# Patient Record
Sex: Female | Born: 1980 | Race: White | Hispanic: No | Marital: Married | Smoking: Never smoker
Health system: Southern US, Community
[De-identification: ages and names within clinical notes are randomized; demographics above are authoritative.]

## PROBLEM LIST (undated history)

## (undated) ENCOUNTER — Inpatient Hospital Stay (HOSPITAL_COMMUNITY): Payer: BC Managed Care – PPO

## (undated) DIAGNOSIS — R200 Anesthesia of skin: Secondary | ICD-10-CM

## (undated) DIAGNOSIS — R202 Paresthesia of skin: Secondary | ICD-10-CM

## (undated) DIAGNOSIS — M545 Low back pain, unspecified: Secondary | ICD-10-CM

## (undated) HISTORY — DX: Anesthesia of skin: R20.0

## (undated) HISTORY — PX: TUBAL LIGATION: SHX77

## (undated) HISTORY — PX: HAND SURGERY: SHX662

## (undated) HISTORY — DX: Low back pain, unspecified: M54.50

## (undated) HISTORY — DX: Paresthesia of skin: R20.2

---

## 2008-03-26 ENCOUNTER — Inpatient Hospital Stay (HOSPITAL_COMMUNITY): Admission: AD | Admit: 2008-03-26 | Discharge: 2008-03-26 | Payer: Self-pay | Admitting: Obstetrics and Gynecology

## 2008-05-01 ENCOUNTER — Inpatient Hospital Stay (HOSPITAL_COMMUNITY): Admission: RE | Admit: 2008-05-01 | Discharge: 2008-05-03 | Payer: Self-pay | Admitting: Obstetrics and Gynecology

## 2008-05-01 ENCOUNTER — Encounter (INDEPENDENT_AMBULATORY_CARE_PROVIDER_SITE_OTHER): Payer: Self-pay | Admitting: Obstetrics and Gynecology

## 2010-10-04 ENCOUNTER — Inpatient Hospital Stay (HOSPITAL_COMMUNITY): Admission: EM | Admit: 2010-10-04 | Discharge: 2010-10-09 | Payer: Self-pay | Source: Home / Self Care

## 2010-11-09 ENCOUNTER — Encounter
Admission: RE | Admit: 2010-11-09 | Discharge: 2010-11-09 | Payer: Self-pay | Source: Home / Self Care | Attending: General Surgery | Admitting: General Surgery
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
## 2010-12-11 ENCOUNTER — Ambulatory Visit (HOSPITAL_BASED_OUTPATIENT_CLINIC_OR_DEPARTMENT_OTHER)
Admission: RE | Admit: 2010-12-11 | Discharge: 2010-12-11 | Disposition: A | Discharge status: Home / Self Care | Payer: BC Managed Care – PPO | Source: Ambulatory Visit | Attending: Plastic Surgery | Admitting: Plastic Surgery

## 2010-12-11 DIAGNOSIS — S61209A Unspecified open wound of unspecified finger without damage to nail, initial encounter: Secondary | ICD-10-CM | POA: Insufficient documentation

## 2010-12-11 DIAGNOSIS — Z189 Retained foreign body fragments, unspecified material: Secondary | ICD-10-CM | POA: Insufficient documentation

## 2010-12-11 LAB — POCT HEMOGLOBIN-HEMACUE: Hemoglobin: 13.4 g/dL (ref 12.0–15.0)
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
## 2010-12-28 LAB — PROTIME-INR
INR: 1.03 (ref 0.00–1.49)
INR: 1.57 — ABNORMAL HIGH (ref 0.00–1.49)

## 2010-12-28 LAB — BASIC METABOLIC PANEL
BUN: 14 mg/dL (ref 6–23)
BUN: 15 mg/dL (ref 6–23)
CO2: 21 mEq/L (ref 19–32)
CO2: 28 mEq/L (ref 19–32)
CO2: 31 mEq/L (ref 19–32)
Calcium: 7.9 mg/dL — ABNORMAL LOW (ref 8.4–10.5)
Creatinine, Ser: 1.02 mg/dL (ref 0.4–1.2)
GFR calc Af Amer: >60 mL/min (ref >60)
GFR calc Af Amer: >60 mL/min (ref >60)
GFR calc non Af Amer: >60 mL/min (ref >60)
GFR calc non Af Amer: >60 mL/min (ref >60)
GFR calc non Af Amer: >60 mL/min (ref >60)
Glucose, Bld: 132 mg/dL — ABNORMAL HIGH (ref 70–99)
Glucose, Bld: 139 mg/dL — ABNORMAL HIGH (ref 70–99)
Potassium: 3 mEq/L — ABNORMAL LOW (ref 3.5–5.1)
Potassium: 3.6 mEq/L (ref 3.5–5.1)
Potassium: 4 mEq/L (ref 3.5–5.1)
Sodium: 136 mEq/L (ref 135–145)
Sodium: 136 mEq/L (ref 135–145)
Sodium: 137 mEq/L (ref 135–145)

## 2010-12-28 LAB — URINALYSIS, ROUTINE W REFLEX MICROSCOPIC
Bilirubin Urine: NEGATIVE
Bilirubin Urine: NEGATIVE
Glucose, UA: NEGATIVE mg/dL
Ketones, ur: 15 mg/dL — AB
Ketones, ur: NEGATIVE mg/dL
Leukocytes, UA: NEGATIVE
Leukocytes, UA: NEGATIVE
Nitrite: NEGATIVE
Protein, ur: 30 mg/dL — AB
Specific Gravity, Urine: 1.028 (ref 1.005–1.030)
Urobilinogen, UA: 0.2 mg/dL (ref 0.0–1.0)
pH: 5.5 (ref 5.0–8.0)

## 2010-12-28 LAB — CBC
HCT: 19.9 % — ABNORMAL LOW (ref 36.0–46.0)
HCT: 24.4 % — ABNORMAL LOW (ref 36.0–46.0)
HCT: 27.3 % — ABNORMAL LOW (ref 36.0–46.0)
HCT: 30.9 % — ABNORMAL LOW (ref 36.0–46.0)
HCT: 30.9 % — ABNORMAL LOW (ref 36.0–46.0)
Hemoglobin: 10.4 g/dL — ABNORMAL LOW (ref 12.0–15.0)
Hemoglobin: 11.2 g/dL — ABNORMAL LOW (ref 12.0–15.0)
Hemoglobin: 6.6 g/dL — CL (ref 12.0–15.0)
Hemoglobin: 7.6 g/dL — ABNORMAL LOW (ref 12.0–15.0)
Hemoglobin: 8.2 g/dL — ABNORMAL LOW (ref 12.0–15.0)
Hemoglobin: 9.2 g/dL — ABNORMAL LOW (ref 12.0–15.0)
MCH: 28.8 pg (ref 26.0–34.0)
MCH: 29.2 pg (ref 26.0–34.0)
MCH: 29.4 pg (ref 26.0–34.0)
MCH: 30 pg (ref 26.0–34.0)
MCHC: 33.2 g/dL (ref 30.0–36.0)
MCHC: 33.6 g/dL (ref 30.0–36.0)
MCHC: 33.7 g/dL (ref 30.0–36.0)
MCHC: 34 g/dL (ref 30.0–36.0)
MCHC: 34.1 g/dL (ref 30.0–36.0)
MCV: 87.3 fL (ref 78.0–100.0)
MCV: 87.3 fL (ref 78.0–100.0)
MCV: 87.5 fL (ref 78.0–100.0)
MCV: 87.9 fL (ref 78.0–100.0)
MCV: 88 fL (ref 78.0–100.0)
Platelets: 103 10*3/uL — ABNORMAL LOW (ref 150–400)
Platelets: 164 10*3/uL (ref 150–400)
RBC: 2.45 MIL/uL — ABNORMAL LOW (ref 3.87–5.11)
RBC: 2.61 MIL/uL — ABNORMAL LOW (ref 3.87–5.11)
RBC: 2.81 MIL/uL — ABNORMAL LOW (ref 3.87–5.11)
RBC: 3.11 MIL/uL — ABNORMAL LOW (ref 3.87–5.11)
RBC: 3.17 MIL/uL — ABNORMAL LOW (ref 3.87–5.11)
RBC: 3.89 MIL/uL (ref 3.87–5.11)
RDW: 12.7 % (ref 11.5–15.5)
RDW: 12.7 % (ref 11.5–15.5)
WBC: 12.7 10*3/uL — ABNORMAL HIGH (ref 4.0–10.5)
WBC: 2 10*3/uL — ABNORMAL LOW (ref 4.0–10.5)
WBC: 2.6 10*3/uL — ABNORMAL LOW (ref 4.0–10.5)
WBC: 4.4 10*3/uL (ref 4.0–10.5)

## 2010-12-28 LAB — URINE MICROSCOPIC-ADD ON

## 2010-12-28 LAB — DIFFERENTIAL
Basophils Absolute: 0 10*3/uL (ref 0.0–0.1)
Eosinophils Absolute: 0 10*3/uL (ref 0.0–0.7)
Eosinophils Relative: 0 % (ref 0–5)
Lymphocytes Relative: 21 % (ref 12–46)
Lymphocytes Relative: 24 % (ref 12–46)
Lymphs Abs: 0.6 10*3/uL — ABNORMAL LOW (ref 0.7–4.0)
Lymphs Abs: 0.9 10*3/uL (ref 0.7–4.0)
Lymphs Abs: 1.3 10*3/uL (ref 0.7–4.0)
Monocytes Absolute: 0.4 10*3/uL (ref 0.1–1.0)
Monocytes Relative: 11 % (ref 3–12)
Monocytes Relative: 8 % (ref 3–12)
Monocytes Relative: 9 % (ref 3–12)
Neutro Abs: 1.6 10*3/uL — ABNORMAL LOW (ref 1.7–7.7)
Neutro Abs: 3.1 10*3/uL (ref 1.7–7.7)
Neutrophils Relative %: 63 % (ref 43–77)

## 2010-12-28 LAB — TYPE AND SCREEN
ABO/RH(D): O POS
Antibody Screen: NEGATIVE
Unit division: 0

## 2010-12-28 LAB — COMPREHENSIVE METABOLIC PANEL
Albumin: 2.2 g/dL — ABNORMAL LOW (ref 3.5–5.2)
BUN: 12 mg/dL (ref 6–23)
Calcium: 5.5 mg/dL — CL (ref 8.4–10.5)
Chloride: 118 mEq/L — ABNORMAL HIGH (ref 96–112)
Creatinine, Ser: 0.87 mg/dL (ref 0.4–1.2)
GFR calc non Af Amer: >60 mL/min (ref >60)
Total Bilirubin: 0.5 mg/dL (ref 0.3–1.2)

## 2010-12-28 LAB — LACTIC ACID, PLASMA: Lactic Acid, Venous: 3 mmol/L — ABNORMAL HIGH (ref 0.5–2.2)

## 2010-12-28 LAB — HEMOGLOBIN AND HEMATOCRIT, BLOOD: Hemoglobin: 8.1 g/dL — ABNORMAL LOW (ref 12.0–15.0)

## 2010-12-28 LAB — POCT I-STAT, CHEM 8
Chloride: 108 mEq/L (ref 96–112)
HCT: 39 % (ref 36.0–46.0)
Hemoglobin: 13.3 g/dL (ref 12.0–15.0)
Potassium: 4.2 mEq/L (ref 3.5–5.1)
Sodium: 139 mEq/L (ref 135–145)

## 2010-12-28 LAB — GLUCOSE, CAPILLARY

## 2011-02-03 ENCOUNTER — Ambulatory Visit (HOSPITAL_COMMUNITY)
Admission: RE | Admit: 2011-02-03 | Discharge: 2011-02-03 | Disposition: A | Discharge status: Home / Self Care | Payer: BC Managed Care – PPO | Source: Ambulatory Visit | Attending: Plastic Surgery | Admitting: Plastic Surgery

## 2011-02-03 DIAGNOSIS — M6281 Muscle weakness (generalized): Secondary | ICD-10-CM | POA: Insufficient documentation

## 2011-02-03 DIAGNOSIS — M25629 Stiffness of unspecified elbow, not elsewhere classified: Secondary | ICD-10-CM | POA: Insufficient documentation

## 2011-02-03 DIAGNOSIS — M25639 Stiffness of unspecified wrist, not elsewhere classified: Secondary | ICD-10-CM | POA: Insufficient documentation

## 2011-02-03 DIAGNOSIS — IMO0001 Reserved for inherently not codable concepts without codable children: Secondary | ICD-10-CM | POA: Insufficient documentation

## 2011-02-03 DIAGNOSIS — M25619 Stiffness of unspecified shoulder, not elsewhere classified: Secondary | ICD-10-CM | POA: Insufficient documentation

## 2011-02-03 DIAGNOSIS — M25539 Pain in unspecified wrist: Secondary | ICD-10-CM | POA: Insufficient documentation

## 2011-02-03 DIAGNOSIS — M25519 Pain in unspecified shoulder: Secondary | ICD-10-CM | POA: Insufficient documentation

## 2011-02-09 ENCOUNTER — Ambulatory Visit (HOSPITAL_COMMUNITY)
Admission: RE | Admit: 2011-02-09 | Discharge: 2011-02-09 | Disposition: A | Discharge status: Home / Self Care | Payer: BC Managed Care – PPO | Source: Ambulatory Visit | Attending: Family Medicine | Admitting: Family Medicine

## 2011-02-12 ENCOUNTER — Ambulatory Visit (HOSPITAL_COMMUNITY)
Admission: RE | Admit: 2011-02-12 | Discharge: 2011-02-12 | Disposition: A | Discharge status: Home / Self Care | Payer: BC Managed Care – PPO | Source: Ambulatory Visit | Attending: Family Medicine | Admitting: Family Medicine

## 2011-02-15 ENCOUNTER — Ambulatory Visit (HOSPITAL_COMMUNITY): Payer: BC Managed Care – PPO | Admitting: Specialist

## 2011-02-18 ENCOUNTER — Ambulatory Visit (HOSPITAL_COMMUNITY)
Admission: RE | Admit: 2011-02-18 | Discharge: 2011-02-18 | Disposition: A | Discharge status: Home / Self Care | Payer: BC Managed Care – PPO | Source: Ambulatory Visit | Attending: Plastic Surgery | Admitting: Plastic Surgery

## 2011-02-18 DIAGNOSIS — IMO0001 Reserved for inherently not codable concepts without codable children: Secondary | ICD-10-CM | POA: Insufficient documentation

## 2011-02-18 DIAGNOSIS — M25619 Stiffness of unspecified shoulder, not elsewhere classified: Secondary | ICD-10-CM | POA: Insufficient documentation

## 2011-02-18 DIAGNOSIS — M25539 Pain in unspecified wrist: Secondary | ICD-10-CM | POA: Insufficient documentation

## 2011-02-18 DIAGNOSIS — M25629 Stiffness of unspecified elbow, not elsewhere classified: Secondary | ICD-10-CM | POA: Insufficient documentation

## 2011-02-18 DIAGNOSIS — M25519 Pain in unspecified shoulder: Secondary | ICD-10-CM | POA: Insufficient documentation

## 2011-02-18 DIAGNOSIS — M6281 Muscle weakness (generalized): Secondary | ICD-10-CM | POA: Insufficient documentation

## 2011-02-18 DIAGNOSIS — M25639 Stiffness of unspecified wrist, not elsewhere classified: Secondary | ICD-10-CM | POA: Insufficient documentation

## 2011-02-25 ENCOUNTER — Ambulatory Visit (HOSPITAL_COMMUNITY)
Admission: RE | Admit: 2011-02-25 | Discharge: 2011-02-25 | Disposition: A | Discharge status: Home / Self Care | Payer: BC Managed Care – PPO | Source: Ambulatory Visit | Admitting: Specialist

## 2011-02-25 ENCOUNTER — Ambulatory Visit (HOSPITAL_COMMUNITY): Payer: BC Managed Care – PPO | Admitting: Specialist

## 2011-03-02 ENCOUNTER — Ambulatory Visit (HOSPITAL_COMMUNITY)
Admission: RE | Admit: 2011-03-02 | Discharge: 2011-03-02 | Disposition: A | Discharge status: Home / Self Care | Payer: BC Managed Care – PPO | Source: Ambulatory Visit | Attending: Family Medicine | Admitting: Family Medicine

## 2011-03-02 NOTE — Op Note (Signed)
Adrienne Bryant, Adrienne Bryant NO.:  [id number]   MEDICAL RECORD NO.:  [id number]          PATIENT TYPE:  INP   LOCATION:  9126                          FACILITY:  [facility]   PHYSICIAN:  Malva Limes, M.D.    DATE OF BIRTH:  [birth year]   DATE OF PROCEDURE:  05/01/2008  DATE OF DISCHARGE:                               OPERATIVE REPORT   PREOPERATIVE DIAGNOSES:  1. Intrauterine pregnancy at term.  2. History of prior cesarean section.  3. The patient desires repeat cesarean section with bilateral tubal      ligation.   POSTOPERATIVE DIAGNOSES:  1. Intrauterine pregnancy at term.  2. History of prior cesarean section.  3. The patient desires repeat cesarean section with bilateral tubal      ligation.   PROCEDURES:  1. Repeat low-transverse cesarean section.  2. Bilateral tubal ligation, Pomeroy procedure.   SURGEON:  Malva Limes, MD   ANESTHESIA:  Luvenia Redden, MD   ANESTHESIA:  Spinal.   ANTIBIOTIC:  Ancef 1 g.   DRAINS:  Foley bedside drainage.   ESTIMATED BLOOD LOSS:  900 mL.   SPECIMENS:  Portion of right and left fallopian tubes sent to pathology.   COMPLICATIONS:  None.   PROCEDURE:  The patient was taken to the operating room where a spinal  anesthetic was administered without complications.  She was then placed  in the dorsal supine position with a left lateral tilt.  The patient was  prepped and draped in the usual fashion for this procedure.  A Foley  catheter was placed.  A Pfannenstiel incision was made through the  previous scar.  On entering the abdominal cavity, the bladder flap was  taken down sharply.  A low-transverse uterine incision was made in the  midline and extended laterally with blunt dissection.  Amniotic sac was  then opened with the Allis.  Fluid was noted to be clear.  The infant  was delivered with a vacuum extractor in the vertex presentation.  There  was a nuchal cord x1, which was reduced.  The remaining infant was then  delivered.  The cord was doubly clamped and cut, and the infant handed  to awaiting NICU team.  Cord blood was obtained.  The placenta was  manually removed.  The uterus was exteriorized and cleaned with wet lap.  The uterine incision was closed in a single layer of 0 Monocryl in a  running locking fashion.  The bladder flap was closed using 2-0 Monocryl  in a running fashion.  The right fallopian tube was then grasped with  the Babcock.  A 2-cm knuckle was doubly ligated with 0 gut suture.  The  knuckle was excised and both ostia were visualized.  Hemostasis appeared  to be adequate.  A similar procedure was performed on the opposite side.  The uterus was placed back in the abdominal cavity.  The parietal  peritoneum and rectus muscles were approximated in the midline using 2-0  Monocryl in a running fashion.  The fascia was closed using 0 Monocryl  suture in a running fashion.  Subcuticular tissue was made hemostatic  with the Bovie.  Stainless steel clips were used to close the skin.  The  patient tolerated the procedure well.  She was taken to the recovery  room in stable condition.  Instrument and lap counts were correct x2.           ______________________________  Malva Limes, M.D.     MA/MEDQ  D:  05/01/2008  T:  05/01/2008  Job:  [id number]

## 2011-03-04 ENCOUNTER — Ambulatory Visit (HOSPITAL_COMMUNITY): Payer: BC Managed Care – PPO | Admitting: Specialist

## 2011-03-05 NOTE — Discharge Summary (Signed)
NAMETEEA, DUCEY NO.:  [id number]   MEDICAL RECORD NO.:  [id number]          PATIENT TYPE:  INP   LOCATION:  9126                          FACILITY:  [facility]   PHYSICIAN:  Malva Limes, M.D.    DATE OF BIRTH:  June 21, 1981   DATE OF ADMISSION:  05/01/2008  DATE OF DISCHARGE:  05/03/2008                               DISCHARGE SUMMARY   FINAL DIAGNOSES:  1. Intrauterine pregnancy at term.  2. History of prior cesarean section.  3. The patient desires repeat cesarean section.  4. The patient desires permanent sterilization.   PROCEDURE:  Repeat low transverse cesarean section and bilateral tubal  ligation using the Pomeroy procedure.   SURGEON:  Malva Limes, MD   ASSISTANT:  Luvenia Redden, MD   COMPLICATIONS:  None.   This 30 year old G3, P 2-0-0-2 presents at term for repeat cesarean  section.  The patient had a prior cesarean section with her last  pregnancy secondary to placenta previa and her desire to repeat with  this pregnancy as well.  The patient also has expressed her desires for  permanent sterilization.  She had a negative group B strep culture  obtained in our office at 36 weeks and no other antepartum  complications.  The patient was taken to the operating room on May 01, 2008, by Dr. Malva Limes where a repeat low transverse cesarean  section was performed with the delivery of a 7 pounds 12 ounces female  infant with Apgars of 9 and 9.  Delivery went without complications.  The patient's postoperative course was benign without any significant  fevers.  She did want her little boy circumcised prior to discharge,  which occurred.  She was felt ready for discharge on postoperative day  #2.  She was sent home on a regular diet, told to decrease activities,  told to continue her vitamins.  She was given Percocet 1-2 every 4 hours  as needed for pain and was to follow up in our office in 4 weeks.  Instructions and precautions were reviewed  with the patient.   LABS ON DISCHARGE:  The patient had a hemoglobin of 9.4, white blood  cell count of 8.1, and platelets of 143,000.     Leilani Able, P.A.-C.      ______________________________  Malva Limes, M.D.   MB/MEDQ  D:  05/30/2008  T:  05/31/2008  Job:  [id number]

## 2011-03-09 ENCOUNTER — Ambulatory Visit (HOSPITAL_COMMUNITY): Payer: BC Managed Care – PPO | Admitting: Specialist

## 2011-03-11 ENCOUNTER — Ambulatory Visit (HOSPITAL_COMMUNITY): Payer: BC Managed Care – PPO | Admitting: Specialist

## 2011-03-16 ENCOUNTER — Ambulatory Visit (HOSPITAL_COMMUNITY)
Admission: RE | Admit: 2011-03-16 | Discharge: 2011-03-16 | Disposition: A | Discharge status: Home / Self Care | Payer: BC Managed Care – PPO | Source: Ambulatory Visit | Attending: Family Medicine | Admitting: Family Medicine

## 2011-03-18 ENCOUNTER — Ambulatory Visit (HOSPITAL_COMMUNITY)
Admission: RE | Admit: 2011-03-18 | Discharge: 2011-03-18 | Disposition: A | Discharge status: Home / Self Care | Payer: BC Managed Care – PPO | Source: Ambulatory Visit | Attending: Family Medicine | Admitting: Family Medicine

## 2011-03-23 ENCOUNTER — Ambulatory Visit (HOSPITAL_COMMUNITY): Payer: BC Managed Care – PPO | Admitting: Specialist

## 2011-03-26 ENCOUNTER — Ambulatory Visit (HOSPITAL_COMMUNITY): Payer: BC Managed Care – PPO | Admitting: Specialist

## 2011-03-30 ENCOUNTER — Ambulatory Visit (HOSPITAL_COMMUNITY): Payer: BC Managed Care – PPO | Admitting: Specialist

## 2011-04-01 ENCOUNTER — Ambulatory Visit (HOSPITAL_COMMUNITY): Payer: BC Managed Care – PPO | Admitting: Specialist

## 2011-04-06 ENCOUNTER — Ambulatory Visit (HOSPITAL_COMMUNITY): Payer: BC Managed Care – PPO | Admitting: Specialist

## 2011-04-08 ENCOUNTER — Ambulatory Visit (HOSPITAL_COMMUNITY): Payer: BC Managed Care – PPO | Admitting: Specialist

## 2011-04-13 ENCOUNTER — Ambulatory Visit (HOSPITAL_COMMUNITY)
Admission: RE | Admit: 2011-04-13 | Discharge: 2011-04-13 | Disposition: A | Discharge status: Home / Self Care | Payer: BC Managed Care – PPO | Source: Ambulatory Visit | Attending: Plastic Surgery | Admitting: Plastic Surgery

## 2011-04-13 DIAGNOSIS — IMO0001 Reserved for inherently not codable concepts without codable children: Secondary | ICD-10-CM | POA: Insufficient documentation

## 2011-04-13 DIAGNOSIS — M25639 Stiffness of unspecified wrist, not elsewhere classified: Secondary | ICD-10-CM | POA: Insufficient documentation

## 2011-04-13 DIAGNOSIS — M25519 Pain in unspecified shoulder: Secondary | ICD-10-CM | POA: Insufficient documentation

## 2011-04-13 DIAGNOSIS — M25539 Pain in unspecified wrist: Secondary | ICD-10-CM | POA: Insufficient documentation

## 2011-04-13 DIAGNOSIS — M6281 Muscle weakness (generalized): Secondary | ICD-10-CM | POA: Insufficient documentation

## 2011-04-13 DIAGNOSIS — M25629 Stiffness of unspecified elbow, not elsewhere classified: Secondary | ICD-10-CM | POA: Insufficient documentation

## 2011-04-13 DIAGNOSIS — M25619 Stiffness of unspecified shoulder, not elsewhere classified: Secondary | ICD-10-CM | POA: Insufficient documentation

## 2011-04-14 ENCOUNTER — Ambulatory Visit (HOSPITAL_COMMUNITY): Payer: BC Managed Care – PPO | Admitting: Specialist

## 2011-04-20 ENCOUNTER — Other Ambulatory Visit (HOSPITAL_COMMUNITY): Payer: Self-pay | Admitting: Orthopedic Surgery

## 2011-04-20 DIAGNOSIS — M75100 Unspecified rotator cuff tear or rupture of unspecified shoulder, not specified as traumatic: Secondary | ICD-10-CM

## 2011-04-23 ENCOUNTER — Ambulatory Visit (HOSPITAL_COMMUNITY)
Admission: RE | Admit: 2011-04-23 | Discharge: 2011-04-23 | Disposition: A | Discharge status: Home / Self Care | Payer: BC Managed Care – PPO | Source: Ambulatory Visit | Attending: Orthopedic Surgery | Admitting: Orthopedic Surgery

## 2011-04-23 DIAGNOSIS — M67919 Unspecified disorder of synovium and tendon, unspecified shoulder: Secondary | ICD-10-CM | POA: Insufficient documentation

## 2011-04-23 DIAGNOSIS — M25519 Pain in unspecified shoulder: Secondary | ICD-10-CM | POA: Insufficient documentation

## 2011-04-23 DIAGNOSIS — M75100 Unspecified rotator cuff tear or rupture of unspecified shoulder, not specified as traumatic: Secondary | ICD-10-CM

## 2011-04-23 DIAGNOSIS — M719 Bursopathy, unspecified: Secondary | ICD-10-CM | POA: Insufficient documentation

## 2011-04-23 DIAGNOSIS — M25619 Stiffness of unspecified shoulder, not elsewhere classified: Secondary | ICD-10-CM | POA: Insufficient documentation

## 2011-07-15 LAB — URINALYSIS, ROUTINE W REFLEX MICROSCOPIC
Glucose, UA: 100 — AB
Protein, ur: NEGATIVE
pH: 6

## 2011-07-15 LAB — URINE MICROSCOPIC-ADD ON

## 2011-07-16 LAB — CBC
HCT: 27.3 — ABNORMAL LOW
HCT: 33.7 — ABNORMAL LOW
Hemoglobin: 11.7 — ABNORMAL LOW
Hemoglobin: 9.4 — ABNORMAL LOW
MCHC: 34.2
MCV: 88.6
MCV: 88.8
RBC: 3.08 — ABNORMAL LOW
RBC: 3.81 — ABNORMAL LOW
RDW: 12.5
WBC: 7.3
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
## 2013-08-03 ENCOUNTER — Other Ambulatory Visit (HOSPITAL_COMMUNITY)
Admission: RE | Admit: 2013-08-03 | Discharge: 2013-08-03 | Disposition: A | Discharge status: Home / Self Care | Payer: BC Managed Care – PPO | Source: Ambulatory Visit | Attending: Obstetrics & Gynecology | Admitting: Obstetrics & Gynecology

## 2013-08-03 ENCOUNTER — Other Ambulatory Visit: Payer: Self-pay | Admitting: Obstetrics & Gynecology

## 2013-08-03 DIAGNOSIS — Z1151 Encounter for screening for human papillomavirus (HPV): Secondary | ICD-10-CM | POA: Insufficient documentation

## 2013-08-03 DIAGNOSIS — Z01419 Encounter for gynecological examination (general) (routine) without abnormal findings: Secondary | ICD-10-CM | POA: Insufficient documentation

## 2017-04-14 DIAGNOSIS — Z01419 Encounter for gynecological examination (general) (routine) without abnormal findings: Secondary | ICD-10-CM | POA: Diagnosis not present

## 2017-04-14 DIAGNOSIS — Z1231 Encounter for screening mammogram for malignant neoplasm of breast: Secondary | ICD-10-CM | POA: Diagnosis not present

## 2017-04-19 ENCOUNTER — Other Ambulatory Visit: Payer: Self-pay | Admitting: Obstetrics & Gynecology

## 2017-04-19 DIAGNOSIS — R928 Other abnormal and inconclusive findings on diagnostic imaging of breast: Secondary | ICD-10-CM

## 2017-04-28 ENCOUNTER — Ambulatory Visit
Admission: RE | Admit: 2017-04-28 | Discharge: 2017-04-28 | Disposition: A | Discharge status: Home / Self Care | Payer: Self-pay | Source: Ambulatory Visit | Attending: Obstetrics & Gynecology | Admitting: Obstetrics & Gynecology

## 2017-04-28 DIAGNOSIS — R928 Other abnormal and inconclusive findings on diagnostic imaging of breast: Secondary | ICD-10-CM

## 2017-08-03 DIAGNOSIS — R110 Nausea: Secondary | ICD-10-CM | POA: Diagnosis not present

## 2017-08-03 DIAGNOSIS — R1030 Lower abdominal pain, unspecified: Secondary | ICD-10-CM | POA: Diagnosis not present

## 2017-08-04 ENCOUNTER — Encounter (HOSPITAL_COMMUNITY): Payer: Self-pay | Admitting: Emergency Medicine

## 2017-08-04 ENCOUNTER — Emergency Department (HOSPITAL_COMMUNITY)
Admission: EM | Admit: 2017-08-04 | Discharge: 2017-08-05 | Disposition: A | Discharge status: Home / Self Care | Payer: Commercial Managed Care - PPO | Attending: Emergency Medicine | Admitting: Emergency Medicine

## 2017-08-04 DIAGNOSIS — R52 Pain, unspecified: Secondary | ICD-10-CM

## 2017-08-04 DIAGNOSIS — K5900 Constipation, unspecified: Secondary | ICD-10-CM | POA: Insufficient documentation

## 2017-08-04 DIAGNOSIS — M791 Myalgia, unspecified site: Secondary | ICD-10-CM | POA: Diagnosis not present

## 2017-08-04 DIAGNOSIS — R110 Nausea: Secondary | ICD-10-CM | POA: Diagnosis not present

## 2017-08-04 DIAGNOSIS — M549 Dorsalgia, unspecified: Secondary | ICD-10-CM | POA: Diagnosis not present

## 2017-08-04 DIAGNOSIS — R51 Headache: Secondary | ICD-10-CM | POA: Insufficient documentation

## 2017-08-04 MED ORDER — ONDANSETRON HCL 4 MG PO TABS
4.0000 mg | ORAL_TABLET | Freq: Once | ORAL | Status: AC
  Administered 2017-08-05: 4 mg via ORAL
  Filled 2017-08-04: qty 1

## 2017-08-04 MED ORDER — FAMOTIDINE 20 MG PO TABS
20.0000 mg | ORAL_TABLET | Freq: Once | ORAL | Status: AC
  Administered 2017-08-05: 20 mg via ORAL
  Filled 2017-08-04: qty 1

## 2017-08-04 MED ORDER — PANTOPRAZOLE SODIUM 40 MG PO TBEC
40.0000 mg | DELAYED_RELEASE_TABLET | Freq: Once | ORAL | Status: AC
  Administered 2017-08-05: 40 mg via ORAL
  Filled 2017-08-04: qty 1

## 2017-08-04 NOTE — ED Provider Notes (Signed)
[facility] EMERGENCY DEPARTMENT Provider Note   CSN: [id number] Arrival date & time: 08/04/17  2131     History   Chief Complaint Chief Complaint  Patient presents with  . Headache  . Nausea  . Back Pain    HPI MIEL WISENER is a 36 y.o. female.  Patient is a 36 year old female who presents to the emergency department with a complaint of headache, nausea, and body aches.  The patient states this problem started on Monday, October 15. Patient states she's been having nausea. She feels at times that the nausea starts in her stomach and comes up to the back of her throat. She complains of a burning sensation in her stomach. She states she's been taking over-the-counter nausea medicine without success. She is also been using ibuprofen but she stopped this when her stomach started burning. She has been using Tylenol and beginning little relief from the Tylenol. She complains of a headache. On yesterday October 17 the patient was seen by her primary physician. It was their opinion that the patient had a virus. A medication for nausea was called into the pharmacy, but the patient did not get this filled. The patient denies any recent fever or unusual chills. There's been no actual vomiting. The patient has had a problem with constipation. There's been no pain or burning with urination. The patient states that she's had her tubes tied and her menstrual cycles were about 3 weeks ago. She denies use of tobacco products, alcohol, or drugs. She does use ibuprofen from time to time, but is not using that in the last few days. She's not dry or outside the country. And to the best of her knowledge she's not been exposed to any sick contacts on. There's been no unusual rash appreciated. The patient has not had any procedures recently. She presents now for assistance with these issues.  The patient states she is concerned that the clinician who saw her on yesterday may be missing a possible stomach ulcer  or gallbladder disease or other more complicated issues that virus.      History reviewed. No pertinent past medical history.  There are no active problems to display for this patient.   Past Surgical History:  Procedure Laterality Date  . HAND SURGERY    . TUBAL LIGATION      OB History    No data available       Home Medications    Prior to Admission medications   Not on File    Family History History reviewed. No pertinent family history.  Social History Social History  Substance Use Topics  . Smoking status: Never Smoker  . Smokeless tobacco: Never Used  . Alcohol use No     Allergies   Patient has no known allergies.   Review of Systems Review of Systems  Constitutional: Positive for appetite change. Negative for activity change, chills and fever.       All ROS Neg except as noted in HPI  HENT: Negative for nosebleeds.   Eyes: Negative for photophobia and discharge.  Respiratory: Negative for cough, shortness of breath and wheezing.   Cardiovascular: Negative for chest pain and palpitations.  Gastrointestinal: Positive for constipation and nausea. Negative for abdominal pain, blood in stool and vomiting.       Indigestion and burning in the stomach  Endocrine: Negative for polydipsia, polyphagia and polyuria.  Genitourinary: Negative for difficulty urinating, dysuria, frequency and hematuria.  Musculoskeletal: Positive for back pain and myalgias.  Negative for arthralgias and neck pain.  Skin: Negative.   Neurological: Positive for headaches. Negative for dizziness, seizures and speech difficulty.  Psychiatric/Behavioral: Negative for confusion and hallucinations.     Physical Exam Updated Vital Signs BP (!) 147/90 (BP Location: Right Arm)   Pulse 70   Temp 97.9 F (36.6 C) (Oral)   Resp 18   Ht 5\' 8"  (1.727 m)   Wt 90.7 kg (200 lb)   LMP 07/14/2017   SpO2 100%   BMI 30.41 kg/m   Physical Exam  Constitutional: She is oriented to  person, place, and time. She appears well-developed and well-nourished.  Non-toxic appearance.  HENT:  Head: Normocephalic.  Right Ear: Tympanic membrane and external ear normal.  Left Ear: Tympanic membrane and external ear normal.  Eyes: Pupils are equal, round, and reactive to light. EOM and lids are normal.  Neck: Normal range of motion. Neck supple. Carotid bruit is not present.  Cardiovascular: Normal rate, regular rhythm, normal heart sounds, intact distal pulses and normal pulses.  Exam reveals no gallop and no friction rub.   No murmur heard. Pulmonary/Chest: Breath sounds normal. No respiratory distress.  Abdominal: Soft. Bowel sounds are normal. There is no tenderness. There is no guarding.  Musculoskeletal: Normal range of motion. She exhibits no edema.  Lymphadenopathy:       Head (right side): No submandibular adenopathy present.       Head (left side): No submandibular adenopathy present.    She has no cervical adenopathy.  Neurological: She is alert and oriented to person, place, and time. She has normal strength. No cranial nerve deficit or sensory deficit. She exhibits normal muscle tone. Coordination normal.  Skin: Skin is warm and dry. No rash noted.  Psychiatric: She has a normal mood and affect. Her speech is normal.  Nursing note and vitals reviewed.    ED Treatments / Results  Labs (all labs ordered are listed, but only abnormal results are displayed) Labs Reviewed - No data to display  EKG  EKG Interpretation None       Radiology No results found.  Procedures Procedures (including critical care time)  Medications Ordered in ED Medications - No data to display   Initial Impression / Assessment and Plan / ED Course  I have reviewed the triage vital signs and the nursing notes.  Pertinent labs & imaging results that were available during my care of the patient were reviewed by me and considered in my medical decision making (see chart for  details).       Final Clinical Impressions(s) / ED Diagnoses MDM Vital signs within normal limits. Patient does not appear to be in distress. She complains of nausea, headache, burning sensation in the stomach, and body aches.  I suggested to the patient that we obtain blood work to take a look at the complete blood count, a comprehensive metabolic panel to evaluate any changes in her biliary system or electrolytes or kidney function. I also suggested that we check a lipase to look at the pancreas function. The patient would have a urinalysis as well as a urine pregnancy test. Further discussed with the patient that if these tests were abnormal then we may need to have her to come in tomorrow morning for ultrasound evaluation, and that she may need to see her primary physician to have a GI evaluation to check further for stomach ulcers since she had a concern about this problem. The patient is in agreement with this plan.  Nursing staff came to report that the patient has changed her mind. She no longer wants to have the testing done. She states that she will see her primary physician and ask for the testing to be done through her doctor's office. Pt asked  to use zantac 150 bid and Rx given for zofran. Pt strongly encouraged to see PCP tomorrow or as soon as possible. Pt to return to the ED if any changes or problem.   Final diagnoses:  Nausea  Generalized body aches    New Prescriptions New Prescriptions   No medications on file     Ivery Quale, Cordelia Poche 08/05/17 0051    Zadie Rhine, MD 08/05/17 [phone]

## 2017-08-04 NOTE — ED Notes (Signed)
Pt also states she doesn't have any appetite and food does not taste right to her.

## 2017-08-04 NOTE — ED Triage Notes (Signed)
Pt c/o nausea/headache since Monday. Mid back area pain since Monday worse in mornings but no pain now. Denies v/d. otc meds for nausea with no relief. Had rx yesterday for nausea but didn't get it. Has been taking tylenol and motrin with relief but ha comes back. Denies urinary changes.

## 2017-08-05 DIAGNOSIS — M791 Myalgia, unspecified site: Secondary | ICD-10-CM | POA: Diagnosis not present

## 2017-08-05 DIAGNOSIS — R110 Nausea: Secondary | ICD-10-CM | POA: Diagnosis not present

## 2017-08-05 DIAGNOSIS — R51 Headache: Secondary | ICD-10-CM | POA: Diagnosis not present

## 2017-08-05 LAB — URINALYSIS, ROUTINE W REFLEX MICROSCOPIC
Bilirubin Urine: NEGATIVE
Glucose, UA: NEGATIVE mg/dL
Hgb urine dipstick: NEGATIVE
KETONES UR: NEGATIVE mg/dL
LEUKOCYTES UA: NEGATIVE
NITRITE: NEGATIVE
PROTEIN: NEGATIVE mg/dL
Specific Gravity, Urine: 1.026 (ref 1.005–1.030)
pH: 5 (ref 5.0–8.0)

## 2017-08-05 LAB — PREGNANCY, URINE: PREG TEST UR: NEGATIVE

## 2017-08-05 MED ORDER — ONDANSETRON HCL 4 MG PO TABS: 4.0000 mg | ORAL_TABLET | Freq: Four times a day (QID) | ORAL | 0 refills | Status: DC

## 2017-08-05 NOTE — Discharge Instructions (Signed)
Your vital signs are within normal limits. The opportunity for screening blood work and labs has been offered, if you should change her mind to have these and other testing done, please return to the emergency department. Please see Dr.Sasser or member of his team to have your symptoms worked up. Zantac 150 mg(over the counter) with breakfast and supper may be helpful. Zofran every 6 hours may be helpful for nausea. Please return to the emergency department if any changes, problems, or concerns.

## 2017-08-05 NOTE — ED Notes (Signed)
Pt stated she will just see her pcp tomorrow for blood work. PA aware.

## 2017-09-14 DIAGNOSIS — M545 Low back pain: Secondary | ICD-10-CM | POA: Diagnosis not present

## 2017-09-14 DIAGNOSIS — R350 Frequency of micturition: Secondary | ICD-10-CM | POA: Diagnosis not present

## 2021-10-14 ENCOUNTER — Encounter: Payer: Self-pay | Admitting: Neurology

## 2021-10-14 ENCOUNTER — Ambulatory Visit (INDEPENDENT_AMBULATORY_CARE_PROVIDER_SITE_OTHER): Payer: Commercial Managed Care - PPO | Admitting: Neurology

## 2021-10-14 ENCOUNTER — Other Ambulatory Visit: Payer: Self-pay

## 2021-10-14 DIAGNOSIS — R200 Anesthesia of skin: Secondary | ICD-10-CM

## 2021-10-14 DIAGNOSIS — M5416 Radiculopathy, lumbar region: Secondary | ICD-10-CM | POA: Insufficient documentation

## 2021-10-14 DIAGNOSIS — M545 Low back pain, unspecified: Secondary | ICD-10-CM | POA: Insufficient documentation

## 2021-10-14 DIAGNOSIS — G5711 Meralgia paresthetica, right lower limb: Secondary | ICD-10-CM

## 2021-10-14 DIAGNOSIS — M79604 Pain in right leg: Secondary | ICD-10-CM | POA: Insufficient documentation

## 2021-10-14 DIAGNOSIS — M5417 Radiculopathy, lumbosacral region: Secondary | ICD-10-CM

## 2021-10-14 MED ORDER — PREGABALIN 50 MG PO CAPS: 50.0000 mg | ORAL_CAPSULE | Freq: Two times a day (BID) | ORAL | 0 refills | Status: DC

## 2021-10-14 NOTE — Patient Instructions (Signed)
Start with Pregabalin 50 mg nightly for one week then increase to 50 mg twice daily  Contact me to let me know if medications works or not  Continue with physical activity  Other considerations include massages, acupuncture and chiropractor. Follow up with your PCP and return in 3 months
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
## 2022-01-14 ENCOUNTER — Ambulatory Visit: Payer: Commercial Managed Care - PPO | Admitting: Neurology

## 2022-01-14 ENCOUNTER — Ambulatory Visit: Payer: Self-pay | Admitting: Neurology

## 2022-02-17 ENCOUNTER — Encounter: Payer: Self-pay | Admitting: Neurology

## 2022-02-17 ENCOUNTER — Telehealth: Payer: Self-pay | Admitting: Neurology

## 2022-02-17 ENCOUNTER — Ambulatory Visit (INDEPENDENT_AMBULATORY_CARE_PROVIDER_SITE_OTHER): Payer: BC Managed Care – PPO | Admitting: Neurology

## 2022-02-17 VITALS — BP 123/82 | HR 87 | Ht 68.0 in | Wt 215.0 lb

## 2022-02-17 DIAGNOSIS — G5711 Meralgia paresthetica, right lower limb: Secondary | ICD-10-CM

## 2022-02-17 DIAGNOSIS — M5417 Radiculopathy, lumbosacral region: Secondary | ICD-10-CM | POA: Diagnosis not present

## 2022-02-17 DIAGNOSIS — M5416 Radiculopathy, lumbar region: Secondary | ICD-10-CM

## 2022-02-17 MED ORDER — PREGABALIN 50 MG PO CAPS: 50.0000 mg | ORAL_CAPSULE | Freq: Three times a day (TID) | ORAL | 3 refills | Status: AC
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
## 2022-02-17 NOTE — Patient Instructions (Addendum)
Restart pregabalin 50 mg in the morning and 100 mg at night, will plan to increase up to 150 mg BID as tolerated ?Repeat MRI lumbar spine since symptoms are worsening ?Referral to neurosurgery for surgical evaluation ?Return in 6 months for follow-up ?

## 2022-02-17 NOTE — Telephone Encounter (Signed)
Referral for Neurosurgery sent to [facility] [phone]. 

## 2022-02-22 NOTE — Telephone Encounter (Signed)
Patient is scheduled on 02/26/2022 at 9:00 AM with Dr. Maisie Fus. ?

## 2022-03-01 ENCOUNTER — Telehealth: Payer: Self-pay | Admitting: Neurology

## 2022-03-01 NOTE — Telephone Encounter (Signed)
[employer] no auth req via [employer] order sent to GI, they will reach out to the patient to schedule.  ?

## 2022-08-26 ENCOUNTER — Ambulatory Visit: Payer: Self-pay | Admitting: Neurology

## 2024-01-13 ENCOUNTER — Other Ambulatory Visit: Payer: Self-pay | Admitting: Obstetrics & Gynecology

## 2024-01-13 DIAGNOSIS — N6452 Nipple discharge: Secondary | ICD-10-CM

## 2024-01-25 ENCOUNTER — Ambulatory Visit
Admission: RE | Admit: 2024-01-25 | Discharge: 2024-01-25 | Disposition: A | Discharge status: Home / Self Care | Source: Ambulatory Visit | Attending: Obstetrics & Gynecology | Admitting: Obstetrics & Gynecology

## 2024-01-25 ENCOUNTER — Other Ambulatory Visit: Payer: Self-pay | Admitting: Obstetrics & Gynecology

## 2024-01-25 DIAGNOSIS — N6452 Nipple discharge: Secondary | ICD-10-CM

## 2024-01-25 DIAGNOSIS — N6310 Unspecified lump in the right breast, unspecified quadrant: Secondary | ICD-10-CM

## 2024-01-25 DIAGNOSIS — R921 Mammographic calcification found on diagnostic imaging of breast: Secondary | ICD-10-CM

## 2024-02-07 ENCOUNTER — Ambulatory Visit
Admission: RE | Admit: 2024-02-07 | Discharge: 2024-02-07 | Disposition: A | Discharge status: Home / Self Care | Source: Ambulatory Visit | Attending: Obstetrics & Gynecology | Admitting: Obstetrics & Gynecology

## 2024-02-07 DIAGNOSIS — R921 Mammographic calcification found on diagnostic imaging of breast: Secondary | ICD-10-CM

## 2024-02-07 DIAGNOSIS — N6310 Unspecified lump in the right breast, unspecified quadrant: Secondary | ICD-10-CM

## 2024-02-07 HISTORY — PX: BREAST BIOPSY: SHX20

## 2024-02-09 LAB — SURGICAL PATHOLOGY
